# Patient Record
Sex: Female | Born: 1960 | Race: White | Hispanic: No | Marital: Single | Smoking: Never smoker
Health system: Southern US, Community
[De-identification: ages and names within clinical notes are randomized; demographics above are authoritative.]

## PROBLEM LIST (undated history)

## (undated) DIAGNOSIS — I10 Essential (primary) hypertension: Secondary | ICD-10-CM

## (undated) DIAGNOSIS — F32A Depression, unspecified: Secondary | ICD-10-CM

## (undated) DIAGNOSIS — E785 Hyperlipidemia, unspecified: Secondary | ICD-10-CM

## (undated) DIAGNOSIS — T7840XA Allergy, unspecified, initial encounter: Secondary | ICD-10-CM

## (undated) DIAGNOSIS — M26609 Unspecified temporomandibular joint disorder, unspecified side: Secondary | ICD-10-CM

## (undated) DIAGNOSIS — R682 Dry mouth, unspecified: Secondary | ICD-10-CM

## (undated) DIAGNOSIS — K589 Irritable bowel syndrome without diarrhea: Secondary | ICD-10-CM

## (undated) DIAGNOSIS — M8580 Other specified disorders of bone density and structure, unspecified site: Secondary | ICD-10-CM

## (undated) DIAGNOSIS — G43909 Migraine, unspecified, not intractable, without status migrainosus: Secondary | ICD-10-CM

## (undated) DIAGNOSIS — B009 Herpesviral infection, unspecified: Secondary | ICD-10-CM

## (undated) DIAGNOSIS — K219 Gastro-esophageal reflux disease without esophagitis: Secondary | ICD-10-CM

## (undated) DIAGNOSIS — F329 Major depressive disorder, single episode, unspecified: Secondary | ICD-10-CM

## (undated) HISTORY — DX: Depression, unspecified: F32.A

## (undated) HISTORY — DX: Migraine, unspecified, not intractable, without status migrainosus: G43.909

## (undated) HISTORY — DX: Other specified disorders of bone density and structure, unspecified site: M85.80

## (undated) HISTORY — DX: Unspecified temporomandibular joint disorder, unspecified side: M26.609

## (undated) HISTORY — DX: Major depressive disorder, single episode, unspecified: F32.9

## (undated) HISTORY — DX: Irritable bowel syndrome, unspecified: K58.9

## (undated) HISTORY — DX: Dry mouth, unspecified: R68.2

## (undated) HISTORY — DX: Hyperlipidemia, unspecified: E78.5

## (undated) HISTORY — DX: Allergy, unspecified, initial encounter: T78.40XA

## (undated) HISTORY — DX: Gastro-esophageal reflux disease without esophagitis: K21.9

## (undated) HISTORY — DX: Herpesviral infection, unspecified: B00.9

## (undated) HISTORY — DX: Essential (primary) hypertension: I10

## (undated) HISTORY — PX: BREAST SURGERY: SHX581

---

## 1990-09-11 HISTORY — PX: NASAL SEPTUM SURGERY: SHX37

## 2001-02-06 ENCOUNTER — Other Ambulatory Visit: Admission: RE | Admit: 2001-02-06 | Discharge: 2001-02-06 | Payer: Self-pay | Admitting: *Deleted

## 2001-04-03 ENCOUNTER — Encounter: Payer: Self-pay | Admitting: *Deleted

## 2001-04-03 ENCOUNTER — Encounter: Admission: RE | Admit: 2001-04-03 | Discharge: 2001-04-03 | Payer: Self-pay | Admitting: *Deleted

## 2002-02-17 ENCOUNTER — Encounter: Payer: Self-pay | Admitting: Family Medicine

## 2002-02-17 ENCOUNTER — Encounter: Admission: RE | Admit: 2002-02-17 | Discharge: 2002-02-17 | Payer: Self-pay | Admitting: Family Medicine

## 2003-03-12 ENCOUNTER — Other Ambulatory Visit: Admission: RE | Admit: 2003-03-12 | Discharge: 2003-03-12 | Payer: Self-pay | Admitting: Obstetrics & Gynecology

## 2003-03-26 ENCOUNTER — Encounter: Admission: RE | Admit: 2003-03-26 | Discharge: 2003-03-26 | Payer: Self-pay | Admitting: Obstetrics & Gynecology

## 2003-03-26 ENCOUNTER — Encounter: Payer: Self-pay | Admitting: Obstetrics & Gynecology

## 2004-03-24 ENCOUNTER — Other Ambulatory Visit: Admission: RE | Admit: 2004-03-24 | Discharge: 2004-03-24 | Payer: Self-pay | Admitting: Obstetrics and Gynecology

## 2004-04-06 ENCOUNTER — Encounter: Admission: RE | Admit: 2004-04-06 | Discharge: 2004-04-06 | Payer: Self-pay | Admitting: Obstetrics and Gynecology

## 2004-09-11 HISTORY — PX: ABDOMINAL HYSTERECTOMY: SHX81

## 2005-04-27 ENCOUNTER — Other Ambulatory Visit: Admission: RE | Admit: 2005-04-27 | Discharge: 2005-04-27 | Payer: Self-pay | Admitting: Obstetrics and Gynecology

## 2005-05-03 ENCOUNTER — Encounter: Admission: RE | Admit: 2005-05-03 | Discharge: 2005-05-03 | Payer: Self-pay | Admitting: Obstetrics and Gynecology

## 2005-12-07 ENCOUNTER — Inpatient Hospital Stay (HOSPITAL_COMMUNITY): Admission: RE | Admit: 2005-12-07 | Discharge: 2005-12-08 | Payer: Self-pay | Admitting: Obstetrics and Gynecology

## 2005-12-07 ENCOUNTER — Encounter (INDEPENDENT_AMBULATORY_CARE_PROVIDER_SITE_OTHER): Payer: Self-pay | Admitting: Specialist

## 2006-05-03 ENCOUNTER — Other Ambulatory Visit: Admission: RE | Admit: 2006-05-03 | Discharge: 2006-05-03 | Payer: Self-pay | Admitting: Obstetrics and Gynecology

## 2006-06-07 ENCOUNTER — Encounter: Admission: RE | Admit: 2006-06-07 | Discharge: 2006-06-07 | Payer: Self-pay | Admitting: Obstetrics and Gynecology

## 2007-08-14 ENCOUNTER — Encounter: Admission: RE | Admit: 2007-08-14 | Discharge: 2007-08-14 | Payer: Self-pay | Admitting: Obstetrics and Gynecology

## 2008-08-21 ENCOUNTER — Encounter: Admission: RE | Admit: 2008-08-21 | Discharge: 2008-08-21 | Payer: Self-pay | Admitting: Obstetrics and Gynecology

## 2009-04-20 LAB — HM COLONOSCOPY

## 2009-06-16 ENCOUNTER — Encounter: Payer: Self-pay | Admitting: Internal Medicine

## 2009-07-06 LAB — CONVERTED CEMR LAB: Pap Smear: NORMAL

## 2009-07-13 ENCOUNTER — Ambulatory Visit: Payer: Self-pay | Admitting: Internal Medicine

## 2009-07-13 DIAGNOSIS — K117 Disturbances of salivary secretion: Secondary | ICD-10-CM | POA: Insufficient documentation

## 2009-07-13 DIAGNOSIS — F39 Unspecified mood [affective] disorder: Secondary | ICD-10-CM | POA: Insufficient documentation

## 2009-07-13 DIAGNOSIS — I10 Essential (primary) hypertension: Secondary | ICD-10-CM | POA: Insufficient documentation

## 2009-07-13 DIAGNOSIS — J309 Allergic rhinitis, unspecified: Secondary | ICD-10-CM | POA: Insufficient documentation

## 2009-07-13 DIAGNOSIS — K219 Gastro-esophageal reflux disease without esophagitis: Secondary | ICD-10-CM

## 2009-07-13 DIAGNOSIS — E785 Hyperlipidemia, unspecified: Secondary | ICD-10-CM

## 2009-07-13 LAB — CONVERTED CEMR LAB
Angiotensin 1 Converting Enzyme: 33 units/L (ref 9–67)
Free T4: 0.9 ng/dL (ref 0.80–1.80)

## 2009-07-14 ENCOUNTER — Telehealth: Payer: Self-pay | Admitting: Internal Medicine

## 2009-07-19 ENCOUNTER — Telehealth: Payer: Self-pay | Admitting: Internal Medicine

## 2009-07-20 ENCOUNTER — Ambulatory Visit: Payer: Self-pay | Admitting: Internal Medicine

## 2009-07-30 ENCOUNTER — Ambulatory Visit: Payer: Self-pay | Admitting: Internal Medicine

## 2009-07-30 DIAGNOSIS — R5381 Other malaise: Secondary | ICD-10-CM

## 2009-07-30 DIAGNOSIS — R5383 Other fatigue: Secondary | ICD-10-CM

## 2009-07-30 LAB — CONVERTED CEMR LAB
ALT: 28 units/L (ref 0–35)
AST: 32 units/L (ref 0–37)
Albumin: 5.1 g/dL (ref 3.5–5.2)
Alkaline Phosphatase: 85 units/L (ref 39–117)
BUN: 12 mg/dL (ref 6–23)
Basophils Absolute: 0 10*3/uL (ref 0.0–0.1)
Bilirubin, Direct: 0.1 mg/dL (ref 0.0–0.3)
Chloride: 102 meq/L (ref 96–112)
Eosinophils Relative: 1 % (ref 0–5)
Glucose, Bld: 98 mg/dL (ref 70–99)
HCT: 40.8 % (ref 36.0–46.0)
Hemoglobin: 13.6 g/dL (ref 12.0–15.0)
Lymphocytes Relative: 31 % (ref 12–46)
Lymphs Abs: 1.5 10*3/uL (ref 0.7–4.0)
Monocytes Absolute: 0.4 10*3/uL (ref 0.1–1.0)
Neutro Abs: 3 10*3/uL (ref 1.7–7.7)
Potassium: 3.8 meq/L (ref 3.5–5.3)
RBC: 4.08 M/uL (ref 3.87–5.11)
RDW: 12.6 % (ref 11.5–15.5)
Sodium: 141 meq/L (ref 135–145)
Total Bilirubin: 0.3 mg/dL (ref 0.3–1.2)
WBC: 4.9 10*3/uL (ref 4.0–10.5)

## 2009-08-03 ENCOUNTER — Encounter: Payer: Self-pay | Admitting: Internal Medicine

## 2009-08-03 ENCOUNTER — Telehealth: Payer: Self-pay | Admitting: Internal Medicine

## 2009-08-20 ENCOUNTER — Ambulatory Visit (HOSPITAL_BASED_OUTPATIENT_CLINIC_OR_DEPARTMENT_OTHER): Admission: RE | Admit: 2009-08-20 | Discharge: 2009-08-20 | Payer: Self-pay | Admitting: Internal Medicine

## 2009-08-20 ENCOUNTER — Ambulatory Visit: Payer: Self-pay | Admitting: Diagnostic Radiology

## 2009-08-20 ENCOUNTER — Ambulatory Visit: Payer: Self-pay | Admitting: Family

## 2009-08-20 DIAGNOSIS — R0789 Other chest pain: Secondary | ICD-10-CM | POA: Insufficient documentation

## 2009-08-27 ENCOUNTER — Ambulatory Visit: Payer: Self-pay | Admitting: Internal Medicine

## 2009-08-27 ENCOUNTER — Ambulatory Visit: Payer: Self-pay | Admitting: Diagnostic Radiology

## 2009-08-27 ENCOUNTER — Ambulatory Visit (HOSPITAL_BASED_OUTPATIENT_CLINIC_OR_DEPARTMENT_OTHER): Admission: RE | Admit: 2009-08-27 | Discharge: 2009-08-27 | Payer: Self-pay | Admitting: Obstetrics and Gynecology

## 2009-08-27 LAB — HM MAMMOGRAPHY

## 2009-09-02 ENCOUNTER — Encounter: Admission: RE | Admit: 2009-09-02 | Discharge: 2009-09-02 | Payer: Self-pay | Admitting: Obstetrics and Gynecology

## 2009-09-17 ENCOUNTER — Ambulatory Visit: Payer: Self-pay | Admitting: Internal Medicine

## 2009-09-23 ENCOUNTER — Encounter: Payer: Self-pay | Admitting: Internal Medicine

## 2009-09-23 ENCOUNTER — Telehealth: Payer: Self-pay | Admitting: Internal Medicine

## 2009-09-28 ENCOUNTER — Telehealth: Payer: Self-pay | Admitting: Internal Medicine

## 2009-10-04 ENCOUNTER — Ambulatory Visit: Payer: Self-pay | Admitting: Internal Medicine

## 2009-10-04 DIAGNOSIS — N951 Menopausal and female climacteric states: Secondary | ICD-10-CM

## 2009-10-04 DIAGNOSIS — G43909 Migraine, unspecified, not intractable, without status migrainosus: Secondary | ICD-10-CM | POA: Insufficient documentation

## 2009-10-04 DIAGNOSIS — R259 Unspecified abnormal involuntary movements: Secondary | ICD-10-CM | POA: Insufficient documentation

## 2009-10-11 ENCOUNTER — Ambulatory Visit: Payer: Self-pay | Admitting: Internal Medicine

## 2009-10-11 DIAGNOSIS — M26629 Arthralgia of temporomandibular joint, unspecified side: Secondary | ICD-10-CM

## 2009-11-12 ENCOUNTER — Ambulatory Visit: Payer: Self-pay | Admitting: Internal Medicine

## 2009-11-12 DIAGNOSIS — K644 Residual hemorrhoidal skin tags: Secondary | ICD-10-CM | POA: Insufficient documentation

## 2010-10-02 ENCOUNTER — Encounter: Payer: Self-pay | Admitting: Obstetrics and Gynecology

## 2010-10-11 NOTE — Assessment & Plan Note (Signed)
Summary: Bad Headaches, ? From a new med.- jr   Vital Signs:  Patient profile:   50 year old female Menstrual status:  hysterectomy Weight:      119 pounds BMI:     22.20 O2 Sat:      96 % on Room air Temp:     98.1 degrees F oral Pulse rate:   75 / minute Pulse rhythm:   regular Resp:     18 per minute BP sitting:   150 / 90  (right arm) Cuff size:   regular  Vitals Entered By: Glendell Docker CMA (October 04, 2009 2:11 PM)  O2 Flow:  Room air  Primary Care Provider:  D. Thomos Lemons DO  CC:  Does not feel good.  History of Present Illness: 50 y/o c/o of severe migraine yesterday.  Pain was severe despite taking ibuprofen.  She took hydrocodone with some relief this AM.  She has assoc nausea.    Since prev visit, pt seen by Dr. Nolen Mu.  Zyprexa switched to geodon.   Lamictal dose increased.  since stopping zyprexa she has restless sensation.  she also has severe insomnia.  she is only able to sleep 1-2 hrs.    Also seen by her GYN.  Pt having severe hot flashes and sweating.    She is still lving in same household as fiance. home life and work life - miserable.  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  no specific tx recommended.  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        increased appetite  Physical Exam  General:  alert, well-developed, and well-nourished.   Head:  normocephalic and atraumatic.   Ears:  R ear normal and L ear normal.   Neck:  supple and no masses.   Lungs:  normal respiratory effort and normal breath sounds.   Heart:  normal rate, regular rhythm, and no gallop.   Psych:  tearful and moderately anxious.     Impression & Recommendations:  Problem # 1:  AKATHISIA (ICD-781.0) Pt switched from zyprexa to geodon.  I suspect some component of her symptoms from akathisia.  Pt to discuss with Dr. Nolen Mu re:  dosing changes.  Problem # 2:  MIGRAINE HEADACHE (ICD-346.90) Assessment: Deteriorated Pt having severe migraines.  It is likely triggered by recent psych med changes.  Continue ibuprofen as needed.  she has hydrocodone on hand for refractory headaches.    Pt unable to function at work due to severe migrains.  I suggest out of work x 1 week then reassess.  Her updated medication list for this problem includes:    Ibuprofen 200 Mg Caps (Ibuprofen) .Marland Kitchen..Marland Kitchen Two tablets by mouth every 6 hours as needed  Problem # 3:  HOT FLASHES (  ICD-627.2) Pt having severe hot flashes. she has hx of hysterectomy.  we discussed risks of HRT.  we agreed to use short term  Her updated medication list for this problem includes:    Estradiol 1 Mg Tabs (Estradiol) ..... 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 Nolen Mu.  Use ambien CR 6.25 mg for sleep.  Complete Medication List: 1)  Vitamin D (ergocalciferol) 50000 Unit Caps (Ergocalciferol) .... One capsule by mouth once per week 2)  Valacyclovir Hcl 1 Gm Tabs (Valacyclovir hcl) .... Take 1 tablet by mouth once a day for 7 days if you experience outbreak/flare 3)  Ambien Cr 6.25 Mg Cr-tabs (Zolpidem tartrate) .... One by mouth at bedtime prn 4)  Probiotic Formula Caps (Probiotic product) .... One capsule by mouth once per week 5)  Amlodipine Besylate  2.5 Mg Tabs (Amlodipine besylate) .... Take 1 tablet by mouth once a day 6)  Simvastatin 40 Mg Tabs (Simvastatin) .... 1/2 by mouth once daily 7)  Fluticasone Propionate 50 Mcg/act Susp (Fluticasone propionate) .... 2 sprays each nostril once daily 8)  Omega-3 350 Mg Caps (Omega-3 fatty acids) .... 2 once daily 9)  Acetyl L-carnitine 250 Mg Caps (Acetylcarnitine hcl) .... Two times a day 10)  Lamotrigine 100 Mg Tabs (Lamotrigine) .... 1/2 by mouth two times a day 11)  Ibuprofen 200 Mg Caps (Ibuprofen) .... Two tablets by mouth every 6 hours as needed 12)  Geodon 60 Mg Caps (Ziprasidone hcl) .... Take 1 capsule by mouth once a day  at bedtime 13)  Estradiol 1 Mg Tabs (Estradiol) .... One by mouth once daily  Patient Instructions: 1)  Please schedule a follow-up appointment in 2 weeks. Prescriptions: ESTRADIOL 1 MG TABS (ESTRADIOL) one by mouth once daily  #30 x 1   Entered and Authorized by:   D. Thomos Lemons DO   Signed by:   D. Thomos Lemons DO on 10/04/2009   Method used:   Electronically to        [facility] (retail)       [address] [place], [place]  [address]       Ph: [phone]       Fax: [phone]   RxID:   [id number] SIMVASTATIN 40 MG TABS (SIMVASTATIN) 1/2 by mouth once daily  #30 x 2   Entered and Authorized by:   D. Thomos Lemons DO   Signed by:   D. Thomos Lemons DO on 10/04/2009   Method used:   Electronically to        [facility] (retail)       [address] [place], [place]  [address]       Ph: [phone]       Fax: [phone]   RxID:   [id number] AMBIEN CR 6.25 MG CR-TABS (ZOLPIDEM TARTRATE) one by mouth at bedtime prn  #30 x 1   Entered and Authorized by:   D. Thomos Lemons DO   Signed by:   D. Thomos Lemons DO on 10/04/2009   Method used:   Print then Give to Patient   RxID:   [id number]   Current Allergies (reviewed today): No known  allergies

## 2010-10-11 NOTE — Progress Notes (Signed)
Summary: Fluticasone Refill  Phone Note Refill Request Message from:  Fax from Pharmacy on September 23, 2009 8:20 AM  Refills Requested: Medication #1:  FLUTICASONE PROPIONATE 50 MCG/ACT SUSP 2 sprays each nostril once daily   Dosage confirmed as above?Dosage Confirmed   Brand Name Necessary? No   Supply Requested: 1 month   Last Refilled: 12/09/2008  Method Requested: Electronic Next Appointment Scheduled: 11-12-09 8[address] Dr Artist Pais  Initial call taken by: Roselle Locus,  September 23, 2009 8:21 AM  Follow-up for Phone Call        Rx completed in Dr. Tiajuana Amass Follow-up by: Glendell Docker CMA,  September 23, 2009 9:56 AM    Prescriptions: FLUTICASONE PROPIONATE 50 MCG/ACT SUSP (FLUTICASONE PROPIONATE) 2 sprays each nostril once daily  #1 x 3   Entered by:   Glendell Docker CMA   Authorized by:   D. Thomos Lemons DO   Signed by:   Glendell Docker CMA on 09/23/2009   Method used:   Electronically to        [facility] (retail)       [address] [place], [place]  [address]       Ph: [phone]       Fax: [phone]   RxID:   [id number]

## 2010-10-11 NOTE — Letter (Signed)
Summary: Work Dietitian at [employer]. [address]   [place], [place] [address]   Phone: [phone]  Fax: [phone]      Today's Date: October 04, 2009   Name of Patient: [name]   The above named patient had a medical visit today .Please take this into consideration when reviewing the time away from work.  Special Instructions:  Arly.Keller ] None  [  ] To be off the remainder of today, returning to the normal work / school schedule tomorrow.  [  ] To be off until the next scheduled appointment on ______________________.  [  ] Other ________________________________________________________________ ________________________________________________________________________     Sincerely yours,   Glendell Docker CMA Dr Thomos Lemons

## 2010-10-11 NOTE — Assessment & Plan Note (Signed)
Summary: 2 month follow up/mhf   Vital Signs:  Patient profile:   50 year old female Menstrual status:  hysterectomy Weight:      119 pounds BMI:     22.20 O2 Sat:      92 % on Room air Temp:     97.9 degrees F oral Pulse rate:   62 / minute Pulse rhythm:   regular Resp:     18 per minute BP sitting:   132 / 80  (right arm) Cuff size:   regular  Vitals Entered By: Glendell Docker CMA (November 12, 2009 8:26 AM)  O2 Flow:  Room air CC: RM 2- Follow up  Is Patient Diabetic? No Comments follow up moving to [place] next week, refills on Anamantle,  transederm pacth, Lamictal,Ambien, Estradiol and Amlodipine   Primary Care Provider:  Dondra Spry DO  CC:  RM 2- Follow up .  History of Present Illness: 50 y/o white female for follow up mood - feeling much better.   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   Neck:  supple and no masses.   Lungs:  normal respiratory effort and normal breath  sounds.   Heart:  normal rate, regular rhythm, no murmur, and no gallop.   Psych:  normally interactive, good eye contact, not anxious appearing, and not depressed appearing.     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  she is planning to move to [facility].   good family support.  Problem # 2:  HOT FLASHES (ICD-627.2) Assessment: Improved good response to estradiol.    Her updated medication list for this problem includes:    Estradiol 1 Mg Tabs (Estradiol) ..... One by mouth once daily  Problem # 3:  HYPERTENSION (ICD-401.9) stable.  Maintain current medication regimen.  Her updated medication list for this problem includes:    Amlodipine Besylate 2.5 Mg Tabs (Amlodipine besylate) .Marland Kitchen... Take 1 tablet by mouth once a day  BP today: 132/80 Prior BP: 126/72 (10/11/2009)  Labs Reviewed: K+: 3.8 (07/30/2009) Creat: : 0.77 (07/30/2009)     Problem # 4:  HEMORRHOIDS, EXTERNAL (ICD-455.3) she gets occ hemorrhoids.  refilled anamantle  Complete Medication List: 1)  Vitamin D (ergocalciferol) 50000 Unit Caps (Ergocalciferol) .... One capsule by mouth once per week 2)  Valacyclovir Hcl 1 Gm Tabs (Valacyclovir hcl) .... Take 1  tablet by mouth once a day for 7 days if you experience outbreak/flare 3)  Ambien Cr 6.25 Mg Cr-tabs (Zolpidem tartrate) .... One by mouth at bedtime prn 4)  Amlodipine Besylate 2.5 Mg Tabs (Amlodipine besylate) .... Take 1 tablet by mouth once a day 5)  Simvastatin 40 Mg Tabs (Simvastatin) .... 1/2 by mouth once daily 6)  Fluticasone Propionate 50 Mcg/act Susp (Fluticasone propionate) .... 2 sprays each nostril once daily 7)  Omega-3 350 Mg Caps (Omega-3 fatty acids) .... 2 once daily 8)  Acetyl L-carnitine 250 Mg Caps (Acetylcarnitine hcl) .... Two times a day 9)  Lamotrigine 100 Mg Tabs (Lamotrigine) .... 1/2 by mouth two times a day 10)  Ibuprofen 200 Mg Caps (Ibuprofen) .... Two  tablets by mouth every 6 hours as needed 11)  Estradiol 1 Mg Tabs (Estradiol) .... One by mouth once daily 12)  Xanax 0.5 Mg Tabs (Alprazolam) .... 1/2 to 1 tablet by mouth once daily as needed 13)  Zoloft 100 Mg Tabs (Sertraline hcl) .... Take 1 tablet by mouth once a day 14)  Transderm-scop 1.5 Mg Pt72 (Scopolamine base) .... Apply new patch q 3 days 15)  Lidocaine-hydrocortisone Ace 3-0.5 % Crea (Lidocaine-hydrocortisone ace) .... Use as directed Prescriptions: LIDOCAINE-HYDROCORTISONE ACE 3-0.5 % CREA (LIDOCAINE-HYDROCORTISONE ACE) use as directed  #7 grams x 5   Entered and Authorized by:   D. Thomos Lemons DO   Signed by:   D. Thomos Lemons DO on 11/12/2009   Method used:   Print then Give to Patient   RxID:   [id number] TRANSDERM-SCOP 1.5 MG PT72 (SCOPOLAMINE BASE) apply new patch q 3 days  #5 x 0   Entered and Authorized by:   D. Thomos Lemons DO   Signed by:   D. Thomos Lemons DO on 11/12/2009   Method used:   Print then Give to Patient   RxID:   [id number] ESTRADIOL 1 MG TABS (ESTRADIOL) one by mouth once daily  #30 x 3   Entered and Authorized by:   D. Thomos Lemons DO   Signed by:   D. Thomos Lemons DO on 11/12/2009   Method used:   Print then Give to Patient   RxID:   [id number] LAMOTRIGINE 100 MG TABS (LAMOTRIGINE) 1/2 by mouth two times a day  #30 x 3   Entered and Authorized by:   D. Thomos Lemons DO   Signed by:   D. Thomos Lemons DO on 11/12/2009   Method used:   Print then Give to Patient   RxID:   [id number] SIMVASTATIN 40 MG TABS (SIMVASTATIN) 1/2 by mouth once daily  #30 x 3   Entered and Authorized by:   D. Thomos Lemons DO   Signed by:   D. Thomos Lemons DO on 11/12/2009   Method used:   Print then Give to Patient   RxID:   [id number] AMLODIPINE BESYLATE 2.5 MG TABS (AMLODIPINE BESYLATE) Take 1 tablet by mouth once a day  #30 x 3   Entered and Authorized by:   D. Thomos Lemons DO   Signed by:   D. Thomos Lemons DO on 11/12/2009   Method used:   Print then Give to  Patient   RxID:   [id number] AMBIEN CR 6.25 MG CR-TABS (ZOLPIDEM TARTRATE) one by mouth at bedtime prn  #30 x 3   Entered and Authorized by:   D. Thomos Lemons DO   Signed by:   D. Thomos Lemons DO on 11/12/2009   Method used:  Print then Give to Patient   RxID:   [id number]   Current Allergies (reviewed today): No known allergies

## 2010-10-11 NOTE — Assessment & Plan Note (Signed)
Summary: 1 WEEK FOLLOW UP/MHF   Vital Signs:  Patient profile:   51 year old female Menstrual status:  hysterectomy Weight:      119 pounds BMI:     22.20 O2 Sat:      98 % on Room air Temp:     98.3 degrees F oral Pulse rate:   84 / minute Pulse rhythm:   regular Resp:     18 per minute BP sitting:   126 / 72  (right arm) Cuff size:   regular  Vitals Entered By: Glendell Docker CMA (October 11, 2009 10:05 AM)  O2 Flow:  Room air  Primary Care Provider:  D. Thomos Lemons DO  CC:  1 Week Followup .  History of Present Illness: 50 y/o white female for follow up. she was seen by Dr. Raquel James - psych.  she was started on zoloft.  feeling better. sleep quality much improved hot flashes resolved with HRT  Hx of TMJ  she as seen at [facility].  there was concern for early shingles.  she started acyclovir left sided facial paraesthesias improved.  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   Head:  no facial rash Ears:  R ear normal and L ear normal.   Neck:  left TMJ tenderness Lungs:  normal respiratory effort and normal breath sounds.   Heart:  normal rate, regular rhythm, and no  gallop.     Impression & Recommendations:  Problem # 1:  HOT FLASHES (ICD-627.2) Assessment Improved hot flashed resolved.  use current dose x 6 months then start to taper  Her updated medication list for this problem includes:    Estradiol 1 Mg Tabs (Estradiol) ..... One by mouth once daily  Problem # 2:  TMJ PAIN (ICD-524.62) Pt recently seen at [facility].  she noted left sided facial pain.  there was concern for possible shingles.  she is taking acyclovir in case symptoms from early shingles.  I agree.  I suspect pain is from TMJ.  pt advised to continue use of oral appliance.    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 Raquel James.  she is on zoloft and alprazolam as needed.  she is on FMLA until March.  We discussed breathing exercises / medication as part of overall treatment to help manage stress and anxiety  Complete Medication List: 1)  Vitamin D (ergocalciferol) 50000 Unit Caps (Ergocalciferol) .... One capsule by mouth once per week 2)  Valacyclovir Hcl 1 Gm Tabs (Valacyclovir hcl) .... Take 1 tablet by mouth once a day for 7 days if you experience outbreak/flare 3)  Ambien Cr 6.25  Mg Cr-tabs (Zolpidem tartrate) .... One by mouth at bedtime prn 4)  Probiotic Formula Caps (Probiotic product) .... One capsule by mouth once per week 5)  Amlodipine Besylate 2.5 Mg Tabs (Amlodipine besylate) .... Take 1 tablet by mouth once a day 6)  Simvastatin 40 Mg Tabs (Simvastatin) .... 1/2 by mouth once daily 7)  Fluticasone Propionate 50 Mcg/act Susp (Fluticasone propionate) .... 2 sprays each nostril once daily 8)  Omega-3 350 Mg Caps (Omega-3 fatty acids) .... 2 once daily 9)  Acetyl L-carnitine 250 Mg Caps (Acetylcarnitine hcl) .... Two times a day 10)  Lamotrigine 100 Mg Tabs (Lamotrigine) .... 1/2 by mouth two times a day 11)  Ibuprofen 200 Mg Caps (Ibuprofen) .... Two tablets by mouth every 6 hours as needed 12)  Estradiol 1 Mg Tabs (Estradiol) ....  One by mouth once daily 13)  Xanax 0.5 Mg Tabs (Alprazolam) .... 1/2 to 1 tablet by mouth once daily as needed 14)  Zoloft 100 Mg Tabs (Sertraline hcl) .... Take 1 tablet by mouth once a day  Current Allergies (reviewed today): No known allergies

## 2010-10-11 NOTE — Progress Notes (Signed)
Summary: FMLA paperwork  Phone Note Call from Patient   Caller: Patient Reason for Call: Talk to Doctor Summary of Call: Cell#[phone]  Patient called today and was very nice on the phone, but is upset and stating that she has left 3 messages for Darlene on her voicemail and has actually talked  to Darlene once in reference to [facility] paperwork. Pt. states that Agustin Cree was very rude to her and was trying to tell the patient that she has already got her FMLA paper work back from [employer] when patient knows for a fact that Darlene did not give her the paperwork back. Patient states, she would just like for Darlene to acknowlege that Paperwork was never given back to patient and that if it was lost it is okay, she would like for [place] simply to fill it out again if needed. Pt. states that she does not want to cause anyone trouble, but she would like to just get her FMLA paperwork back and not argue with Darlene. She is very happy with being a patient of Dr.Dashanti Burr's , but is not impressed with Darlene's bed side manner. I do not see anywhere in EMR where Darlene and pt's conversation was documented. Pt. was very nice and asked me just to talk to Dr.Lamir Racca about it and call her back at the cell number she left. Can we help this patient resolve this issue? Thank you for your help, Michaelle Copas Initial call taken by: Michaelle Copas,  September 28, 2009 2:29 PM  Follow-up for Phone Call        please ask pt to drop off FMLA again.  we do not copy in the office.  I am not sure what happened to it Follow-up by: D. Thomos Lemons DO,  September 28, 2009 2:52 PM  Additional Follow-up for Phone Call Additional follow up Details #1::        informed patient that she can drop off her paper work again and Dr.Perrie Ragin will be glad to fill out her paper work for her again. Pt. states she will bring by this week and was very appreciative. Michaelle Copas  September 28, 2009 2:59 PM

## 2010-10-11 NOTE — Consult Note (Signed)
Summary: [facility]  [facility]   Imported By: Lanelle Bal 11/04/2009 10:15:09  _____________________________________________________________________  External Attachment:    Type:   Image     Comment:   External Document

## 2010-10-11 NOTE — Assessment & Plan Note (Signed)
Summary: 2 week follow up/mhf   Vital Signs:  Patient profile:   50 year old female Menstrual status:  hysterectomy Weight:      118.25 pounds BMI:     22.06 O2 Sat:      99 % on Room air Temp:     98.4 degrees F oral Pulse rate:   75 / minute Pulse rhythm:   regular Resp:     16 per minute BP sitting:   132 / 80  (right arm) Cuff size:   regular  Vitals Entered By: Glendell Docker CMA (September 17, 2009 3:50 PM)  O2 Flow:  Room air  Primary Care Provider:  D. Thomos Lemons DO  CC:  2 Week Follow up .  History of Present Illness: 2 week follow up   Interval Psychiatric History      The patient comes in today for follow-up individual therapy.  The patient complains of insomnia and disturbed sleep pattens.  The patient reports relationship difficulties and poor self-image.  Overall, the patient reports that they feel that their current state of mental health has remained unchanged.  she has noticed increased appetite with zyprexa.  ongoing arguments with her boss.  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   Lungs:  normal respiratory effort and normal breath sounds.   Heart:  normal rate, regular rhythm, and no gallop.   Psych:  normally interactive and slightly anxious.     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   she has noticed  increase in appetite with zyprexa but no wt gain.  continue lamictal.  arrange f/u with Dr. Nolen Mu.  Complete Medication List: 1)  Vitamin D (ergocalciferol) 50000 Unit Caps (Ergocalciferol) .... One capsule by mouth once per week 2)  Valacyclovir Hcl 1 Gm Tabs (Valacyclovir hcl) .... Take 1 tablet by mouth once a day for 7 days if you experience outbreak/flare 3)  Ambien 10 Mg Tabs (Zolpidem tartrate) .... Take 1 tablet by mouth once a day 4)  Probiotic Formula Caps (Probiotic product) .... One capsule by mouth once per week 5)  Amlodipine Besylate 2.5 Mg Tabs (Amlodipine besylate) .... Take 1 tablet by mouth once a day 6)  Zocor 80 Mg Tabs (Simvastatin) .... Take 1/2  tablet by mouth once a day 7)  Fluticasone Propionate 50 Mcg/act Susp (Fluticasone propionate) .... 2 sprays each nostril once daily 8)  Omega-3 350 Mg Caps (Omega-3 fatty acids) .... 2 once daily 9)  Acetyl L-carnitine 250 Mg Caps (Acetylcarnitine hcl) .... Two times a day 10)  Lamotrigine 100 Mg Tabs (Lamotrigine) .... 1/2 by mouth two times a day 11)  Ibuprofen 200 Mg Caps (Ibuprofen) .... Two tablets by mouth every 6 hours as needed 12)  Zyprexa 5 Mg Tabs (Olanzapine) .... One by mouth at bedtime  Patient Instructions: 1)  Please schedule a follow-up appointment in 2 months.  Current Allergies (reviewed today): No known allergies

## 2011-01-27 NOTE — H&P (Signed)
NAME:  [name], [name]                ACCOUNT NO.:  [id number]   MEDICAL RECORD NO.:  [id number]          PATIENT TYPE:  AMB   LOCATION:  SDC                           FACILITY:  [facility]   PHYSICIAN:  Osborn Coho, M.D.   DATE OF BIRTH:  06/05/1961   DATE OF ADMISSION:  DATE OF DISCHARGE:                                HISTORY & PHYSICAL   HISTORY OF PRESENT ILLNESS:  [name] is a 50 year old single white  female, gravida 0, who presents for total vaginal hysterectomy because of  symptomatic uterine fibroids, dyspareunia and post coital bleeding.  The  patient's menstrual periods are characterized by a four day flow, in which  she changes a bad every four hours and finds relief from her cramping with  over-the-counter nonsteroidal antiinflammatory medications.  For the past  six months, the reports experiencing deep dyspareunia, along with post  coital bleeding, which has been quite disturbing.  In spite of a course of  oral contraceptives and Lupron, which she used for three months, the patient  still continued postcoital bleeding, though her pain diminished.  A pelvic  ultrasound with sonohistogram in November 2006 showed a normal endometrium  and three measurable fibroids with the largest measuring 5 x 4 cm.  After  having been on Lupron for three months, a followup ultrasound showed that  there had been no changes in the size of her fibroids.  Additionally, the  patient has never had an abnormal Pap smear, with her most recent one being  in August 2006. Due to the failure of two medical therapies and the  disruptive nature of her symptoms, the patient wishes to proceed with  definitive therapy in the form of hysterectomy.   OBSTETRIC HISTORY:  Gravida 0.   GYNECOLOGIC HISTORY:  Menarche at 50 years old.  The patient's last  menstrual period was October 09, 2005 (on Lupron).  She uses vasectomy as  her method of contraception. 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  Her last Pap smear was August  2006.   MEDICAL HISTORY:  1.  Seasonal allergies.  2.  Irritable bowel syndrome.  3.  Migraines.  4.  TMJ.  5.  Depression.  6.  Anxiety.  7.  Osteopenia.   SURGICAL HISTORY:  1.  1984 breast augmentation.  2.  1992 sinus surgery with repair of deviated septum and chin implant.  3.  1994 breast augmentation.  4.  2005 eye lid surgery.   The patient denies any problems with anesthesia and denies any blood  transfusions.   FAMILY HISTORY:  Positive for rheumatic fever, hypertension, diabetes  mellitus, cardiovascular disease and mitral valve prolapse.   SOCIAL HISTORY:  The patient is single.  She lives with her fiancee and  works as a Pharmacologist.   HABITS:  She does not use tobacco.  She consumes alcohol occasionally,  denies any illicit drug use.   CURRENT MEDICATIONS:  1.  Lexapro 10 mg daily.  2.  Zelnorm 6 mg daily.  3.  Zyrtec  10 mg as needed.  4.  Allegra 180 mg as needed.   The patient has no known drug allergies.   REVIEW OF SYSTEMS:  The patient denies any chest pain, shortness of breath,  fever, cough, nausea, vomiting, diarrhea or urinary tract symptoms.  She  does admit, however to occasional constipation.  Otherwise, except as  mentioned in history of present illness, the patient's review of systems are  negative.   PHYSICAL EXAMINATION:  VITAL SIGNS:  Blood pressure 130/72.  Height 5 fee 2  inches tall.  Weight 112 pounds.  NECK:  Supple without masses.  There is no thyromegaly or adenopathy.  HEART:  Regular rate and rhythm.  There is no murmur.  LUNGS:  Clear to auscultation.  BACK:  No CVA tenderness.  ABDOMEN:  Bowel sounds are present.  It is soft without tenderness,  guarding, rebound or organomegaly.  EXTREMITIES:  Without clubbing, cyanosis or edema.  PELVIC:  EG BUS is within normal limits.  Vagina is rugose.  Cervix is  nontender without lesions.  Uterus is  enlarged to 14 week size, nontender.  Adnexa without tenderness or masses.   IMPRESSION:  1.  Symptomatic uterine fibroids.  2.  Dyspareunia.  3.  Post coital bleeding.   DISPOSITION:  A discussion was held with patient regarding the indications  for her procedure, along with the risks, which include, but are not limited  to reaction to anesthesia, damage to adjacent organs, infection, excessive  bleeding and the possibility that her surgery may have to be done  abdominally if the size of her uterus precludes a vaginal procedure.  The  patient has consented to proceed with a total vaginal hysterectomy with the  possibility of a total abdominal hysterectomy at [facility] on December 07, 2005 at 9:30 a.m.      Elmira J. Adline Peals.      Osborn Coho, M.D.  Electronically Signed    EJP/MEDQ  D:  12/05/2005  T:  12/05/2005  Job:  [id number]

## 2011-01-27 NOTE — Discharge Summary (Signed)
[name], [name]                ACCOUNT NO.:  [id number]   MEDICAL RECORD NO.:  [id number]          PATIENT TYPE:  INP   LOCATION:  9302                          [facility]ACILITY:  [facility]   PHYSICIAN:  Osborn Coho, M.D.   DATE OF BIRTH:  [birth year]   DATE OF ADMISSION:  12/07/2005  DATE OF DISCHARGE:  12/08/2005                                 DISCHARGE SUMMARY   DISCHARGE DIAGNOSES:  1.  Fibroid uterus.  2.  Adenomyosis.  3.  Endometrial polyp.  4.  Complex endometrial hyperplasia without atypia.  5.  Pelvic adhesions.  6.  Dyspareunia and postcoital bleeding.   OPERATION:  On the date of admission, the patient underwent a total vaginal  hysterectomy with uterine morcellation, lysis of adhesions, and cystoscopy  tolerating all procedures well.  The patient was found to have a uterus  which was enlarged with a 6-cm fundal fibroid with an aggregate weight of  185.9 grams.  The patient's ureters were found to be patent bilaterally by  cystoscopy.   HISTORY OF PRESENT ILLNESS:  [name] is a 50 year old, single, white  female, gravida 0 who presents for total vaginal hysterectomy because of  symptomatic uterine fibroids, dyspareunia, and post coital bleeding.  Please  see the patient's dictated history and physical examination for details.   PREOPERATIVE PHYSICAL EXAMINATION:  VITAL SIGNS:  Blood pressure 130/72,  height is 5 feet 2 inches tall, weight 112 pounds.  GENERAL:  Within normal limits.  PELVIC:  EG/BUS is within normal limits.  Vagina is rugose.  Cervix is  nontender without lesions.  Uterus is enlarged to 14 weeks size without  tenderness.  Adnexa without tenderness or masses.   HOSPITAL COURSE:  On the date of admission, the patient underwent  aforementioned procedures, tolerating them all well.  By post-op day #1, the  patient had resumed bowel and bladder function and tolerated a post-op  hemoglobin of 9.9 (pre-op hemoglobin was 13.8) and was therefore deemed  ready  for discharge home.   DISCHARGE MEDICATIONS:  1.  Tylox 1-2 tablets every four hours as needed for pain.  2.  Colace 100 mg, one tablet twice daily until bowel movements are regular.  3.  Phenergan 25 mg, one tablet every six hours as needed for nausea.  4.  Repleva (iron) one tablet daily for 21 days.  To discontinue for seven      days, then resume for another 21 days.  5.  Ciprofloxacin 250 mg twice daily for five days.   FOLLOWUP:  The patient is scheduled for a 6 weeks postoperative visit with  Dr. Su Hilt on Jan 17, 2006 at 2:30 p.m.   DISCHARGE INSTRUCTIONS:  1.  The patient was given a copy of [facility] postoperative      instruction sheet.  2.  She was further advised to avoid driving for two weeks, heavy lifting      for four weeks, intercourse for six weeks.  3.  That she may shower, walk up steps, should increase her activities      slowly.   DIET:  Without restriction.   FINAL PATHOLOGY:  Uterus hysterectomy:  Uterine cervix with benign  ectocervical and endocervical mucosa.  Uterine corpus with benign  proliferative endometrium, adenomyosis, intramural leiomyomata, and  endometrial polyp with complex endometrial hyperplasia without atypia.      Elmira J. Adline Peals.      Osborn Coho, M.D.  Electronically Signed    EJP/MEDQ  D:  12/26/2005  T:  12/27/2005  Job:  [id number]

## 2011-01-27 NOTE — Op Note (Signed)
[name], [name]                ACCOUNT NO.:  [id number]   MEDICAL RECORD NO.:  [id number]          PATIENT TYPE:  AMB   LOCATION:  SDC                           [facility]   PHYSICIAN:  Osborn Coho, M.D.   DATE OF BIRTH:  1961/03/02   DATE OF PROCEDURE:  12/07/2005  DATE OF DISCHARGE:                                 OPERATIVE REPORT   PREOPERATIVE DIAGNOSIS:  1.  Symptomatic fibroid uterus.  2.  Postcoital bleeding.  3.  Dyspareunia.   POSTOPERATIVE DIAGNOSIS:  1.  Symptomatic fibroid uterus.  2.  Postcoital bleeding.  3.  Dyspareunia.  4.  Pelvic adhesions.   PROCEDURE:  Total vaginal hysterectomy with uterine morcellation, lysis of  adhesions and cystoscopy.   ATTENDING:  Osborn Coho, M.D.   ASSISTANT:  Marquis Lunch. Adline Peals.   ANESTHESIA:  General.   SPECIMEN:  To pathology; uterus, cervix and fibroids.   ESTIMATED BLOOD LOSS:  500 mL.   URINE OUTPUT:  350 mL.   IV FLUIDS:  2500 mL.   COMPLICATIONS:  None.   FINDINGS:  Large fundal fibroid approximately 6 cm.   PROCEDURE:  The patient was taken to the operating room after risks,  benefits, alternatives reviewed with the patient.  The patient verbalized  understanding and consent signed and witnessed.  The patient was placed  under general anesthesia, prepped and draped in normal sterile fashion in  the dorsolithotomy position.  A weighted speculum was placed in the  patient's vagina and the cervix circumscribed with the Bovie.  The posterior  cul-de-sac was entered and the anterior cul-de-sac entered as well.  The  paracervical tissue was clamped with the Heaney clamp, cut and suture  ligated using 0 Vicryl incorporating the cardinal and uterosacral ligaments.  The cervix was amputated as it was difficult to get to the upper portion of  the uterus secondary to the patient is nulliparous.  Uterus was also noted  to be deviated to the patient's left.  The parametrial tissue was clamped  with the  curved Heaney, cut and suture ligated using 0 Vicryl and a large  fundal fibroid was noted.  The fibroid was morcellated in order to remove  vaginally and the remaining utero-ovarian pedicles were clamped, cut and  tied with free ties.  Several adhesions were noted to the uterine fundus  particularly on the patient's left.  Secondary to multiple adhesions and  deviation of the uterus, cystoscopy was performed and indigo carmine  administered with bilateral efflux of indigo carmine from the ureters.  There was oozing noted from bilateral adnexa which was repaired with 2-0  Vicryl and cauterized with the Bovie.  The main portion of the oozing was  noted on the left and several adhesions had to be removed in order to obtain  hemostasis.  Bilateral ovaries appeared within normal limits. The cuff was  closed with 0 Vicryl via interrupted stitches and the vagina was packed with  2-0 packing soaked with Estrace cream.  Sponge lap and needle count was  correct.  The patient tolerated the procedure well and is  currently being  transferred to the recovery room.      Osborn Coho, M.D.  Electronically Signed     AR/MEDQ  D:  12/07/2005  T:  12/08/2005  Job:  [id number]

## 2011-03-09 ENCOUNTER — Encounter: Payer: Self-pay | Admitting: Internal Medicine

## 2013-05-08 ENCOUNTER — Ambulatory Visit: Payer: Self-pay | Admitting: Oncology

## 2013-05-08 ENCOUNTER — Ambulatory Visit: Payer: Self-pay

## 2015-09-11 ENCOUNTER — Encounter (HOSPITAL_BASED_OUTPATIENT_CLINIC_OR_DEPARTMENT_OTHER): Payer: Self-pay | Admitting: *Deleted

## 2015-09-11 ENCOUNTER — Emergency Department (HOSPITAL_BASED_OUTPATIENT_CLINIC_OR_DEPARTMENT_OTHER)
Admission: EM | Admit: 2015-09-11 | Discharge: 2015-09-11 | Disposition: A | Discharge status: Home / Self Care | Payer: Managed Care, Other (non HMO) | Attending: Emergency Medicine | Admitting: Emergency Medicine

## 2015-09-11 ENCOUNTER — Emergency Department (HOSPITAL_BASED_OUTPATIENT_CLINIC_OR_DEPARTMENT_OTHER): Payer: Managed Care, Other (non HMO)

## 2015-09-11 DIAGNOSIS — I10 Essential (primary) hypertension: Secondary | ICD-10-CM | POA: Diagnosis not present

## 2015-09-11 DIAGNOSIS — Z8719 Personal history of other diseases of the digestive system: Secondary | ICD-10-CM | POA: Insufficient documentation

## 2015-09-11 DIAGNOSIS — Z793 Long term (current) use of hormonal contraceptives: Secondary | ICD-10-CM | POA: Diagnosis not present

## 2015-09-11 DIAGNOSIS — M79602 Pain in left arm: Secondary | ICD-10-CM | POA: Diagnosis present

## 2015-09-11 DIAGNOSIS — R0602 Shortness of breath: Secondary | ICD-10-CM | POA: Diagnosis not present

## 2015-09-11 DIAGNOSIS — R200 Anesthesia of skin: Secondary | ICD-10-CM | POA: Insufficient documentation

## 2015-09-11 DIAGNOSIS — E785 Hyperlipidemia, unspecified: Secondary | ICD-10-CM | POA: Diagnosis not present

## 2015-09-11 DIAGNOSIS — Z7951 Long term (current) use of inhaled steroids: Secondary | ICD-10-CM | POA: Diagnosis not present

## 2015-09-11 DIAGNOSIS — R05 Cough: Secondary | ICD-10-CM | POA: Insufficient documentation

## 2015-09-11 DIAGNOSIS — R202 Paresthesia of skin: Secondary | ICD-10-CM | POA: Insufficient documentation

## 2015-09-11 DIAGNOSIS — Z79899 Other long term (current) drug therapy: Secondary | ICD-10-CM | POA: Insufficient documentation

## 2015-09-11 DIAGNOSIS — Z8619 Personal history of other infectious and parasitic diseases: Secondary | ICD-10-CM | POA: Diagnosis not present

## 2015-09-11 DIAGNOSIS — M542 Cervicalgia: Secondary | ICD-10-CM | POA: Diagnosis not present

## 2015-09-11 DIAGNOSIS — G43909 Migraine, unspecified, not intractable, without status migrainosus: Secondary | ICD-10-CM | POA: Diagnosis not present

## 2015-09-11 DIAGNOSIS — F319 Bipolar disorder, unspecified: Secondary | ICD-10-CM | POA: Diagnosis not present

## 2015-09-11 LAB — CBC
HCT: 41.5 % (ref 36.0–46.0)
HEMOGLOBIN: 13.5 g/dL (ref 12.0–15.0)
MCH: 32.6 pg (ref 26.0–34.0)
MCHC: 32.5 g/dL (ref 30.0–36.0)
MCV: 100.2 fL — ABNORMAL HIGH (ref 78.0–100.0)
Platelets: 374 10*3/uL (ref 150–400)
RBC: 4.14 MIL/uL (ref 3.87–5.11)
RDW: 12.7 % (ref 11.5–15.5)
WBC: 8.1 10*3/uL (ref 4.0–10.5)

## 2015-09-11 LAB — BASIC METABOLIC PANEL
Anion gap: 8 (ref 5–15)
BUN: 12 mg/dL (ref 6–20)
CALCIUM: 9.6 mg/dL (ref 8.9–10.3)
CO2: 27 mmol/L (ref 22–32)
Chloride: 99 mmol/L — ABNORMAL LOW (ref 101–111)
Creatinine, Ser: 0.81 mg/dL (ref 0.44–1.00)
GFR calc Af Amer: >60 mL/min (ref >60)
GFR calc non Af Amer: >60 mL/min (ref >60)
Glucose, Bld: 95 mg/dL (ref 65–99)
Potassium: 4.2 mmol/L (ref 3.5–5.1)
SODIUM: 134 mmol/L — AB (ref 135–145)

## 2015-09-11 LAB — TROPONIN I

## 2015-09-11 NOTE — Discharge Instructions (Signed)
Paresthesia Paresthesia is an abnormal burning or prickling sensation. This sensation is generally felt in the hands, arms, legs, or feet. However, it may occur in any part of the body. Usually, it is not painful. The feeling may be described as:  Tingling or numbness.  Pins and needles.  Skin crawling.  Buzzing.  Limbs falling asleep.  Itching. Most people experience temporary (transient) paresthesia at some time in their lives. Paresthesia may occur when you breathe too quickly (hyperventilation). It can also occur without any apparent cause. Commonly, paresthesia occurs when pressure is placed on a nerve. The sensation quickly goes away after the pressure is removed. For some people, however, paresthesia is a long-lasting (chronic) condition that is caused by an underlying disorder. If you continue to have paresthesia, you may need further medical evaluation. HOME CARE INSTRUCTIONS Watch your condition for any changes. Taking the following actions may help to lessen any discomfort that you are feeling:  Avoid drinking alcohol.  Try acupuncture or massage to help relieve your symptoms.  Keep all follow-up visits as directed by your health care provider. This is important. SEEK MEDICAL CARE IF:  You continue to have episodes of paresthesia.  Your burning or prickling feeling gets worse when you walk.  You have pain, cramps, or dizziness.  You develop a rash. SEEK IMMEDIATE MEDICAL CARE IF:  You feel weak.  You have trouble walking or moving.  You have problems with speech, understanding, or vision.  You feel confused.  You cannot control your bladder or bowel movements.  You have numbness after an injury.  You faint.   This information is not intended to replace advice given to you by your health care provider. Make sure you discuss any questions you have with your health care provider.   Document Released: 08/18/2002 Document Revised: 01/12/2015 Document Reviewed:  08/24/2014 Elsevier Interactive Patient Education 2016 [employer] Inc[employer]  

## 2015-09-11 NOTE — ED Provider Notes (Signed)
CSN: [id number]     Arrival date & time 09/11/15  1412 History  By signing my name below, I, Budd Palmer, attest that this documentation has been prepared under the direction and in the presence of Azalia Bilis, MD. Electronically Signed: Budd Palmer, ED Scribe. 09/11/2015. 4:12 PM.    Chief Complaint  Patient presents with  . Arm Pain   The history is provided by the patient. No language interpreter was used.   HPI Comments: [name] is a 54 y.o. female with a PMHx of HTN, HLD, and migraines who presents to the Emergency Department complaining of constant, tightness left arm pain onset 6 hours ago. Pt states she was holding her phone when the pain began, but states that after she put the phone down and rubbed the arm, the pain did not resolve. She notes it feels as though the circulation of the lower arm has been cut-off. She reports associated headache (onset this morning), numbness and tingling along the arm radiating up the left side of the neck and to the left ear, as well as pain on the left side of the neck. She notes she currently has a cold with associated mild cough and some SOB (yesterday), and diaphoresis and has been taking OTC medication for this. She states she also had a shot of bourbon with her tea for the cough. She denies any recent trauma or injury. She notes she is here for the holidays from [place]. She notes she is scheduled for surgery on her foot in a few months. She denies a PMHx of DM and states she does not smoke. Pt denies clumsiness of the left hand, numbness or tingling of the LLE, and fever.   Past Medical History  Diagnosis Date  . Allergy   . IBS (irritable bowel syndrome)   . Depression     Bipolar  . Migraines   . TMJ (temporomandibular joint disorder)   . Osteopenia   . Hypertension   . Hyperlipidemia   . GERD (gastroesophageal reflux disease)   . HSV-1 (herpes simplex virus 1) infection     positive antibody  . HSV-2 (herpes simplex virus  2) infection     postive antibody, no hx fo genital ulcers  . Dry mouth     negative Sjogrens work up   Past Surgical History  Procedure Laterality Date  . Breast surgery  1984, 1994, 2008    augmentation in 1984 with silicon implants that leaked-revised in 1994 then 2008 (left breast)  . Nasal septum surgery  1992  . Abdominal hysterectomy  2006   Family History  Problem Relation Age of Onset  . Depression Sister   . Hyperlipidemia Other   . Hypertension Other    Social History  Substance Use Topics  . Smoking status: Never Smoker   . Smokeless tobacco: None  . Alcohol Use: No   OB History    No data available     Review of Systems A complete 10 system review of systems was obtained and all systems are negative except as noted in the HPI and PMH.   Allergies  Review of patient's allergies indicates not on file.  Home Medications   Prior to Admission medications   Medication Sig Start Date End Date Taking? Authorizing Provider  lisinopril (PRINIVIL,ZESTRIL) 20 MG tablet Take 20 mg by mouth daily.   Yes Historical Provider, MD  Acetylcarnitine HCl (ACETYL L-CARNITINE) 250 MG CAPS Take 1 capsule by mouth 2 (two) times daily.  Historical Provider, MD  ergocalciferol (VITAMIN D2) 50000 UNITS capsule Take 50,000 Units by mouth once a week.      Historical Provider, MD  estradiol (ESTRACE) 1 MG tablet Take 1 mg by mouth daily.      Historical Provider, MD  fluticasone (FLONASE) 50 MCG/ACT nasal spray Place 2 sprays into the nose daily.      Historical Provider, MD  ibuprofen (ADVIL,MOTRIN) 200 MG tablet Take 400 mg by mouth every 6 (six) hours as needed.      Historical Provider, MD  lamoTRIgine (LAMICTAL) 100 MG tablet Take 1/2 tablet by mouth two times a day     Historical Provider, MD  Lidocaine-Hydrocortisone Ace 3-0.5 % CREA Apply topically as directed.      Historical Provider, MD  Omega-3 350 MG CAPS Take 2 capsules by mouth daily.      Historical Provider, MD   simvastatin (ZOCOR) 40 MG tablet Take 40 mg by mouth at bedtime.      Historical Provider, MD   BP 136/72 mmHg  Pulse 71  Temp(Src) 98.3 F (36.8 C) (Oral)  Resp 18  Ht 5\' 1"  (1.549 m)  Wt 135 lb (61.236 kg)  BMI 25.52 kg/m2  SpO2 100% Physical Exam  Constitutional: She is oriented to person, place, and time. She appears well-developed and well-nourished. No distress.  HENT:  Head: Normocephalic and atraumatic.  Eyes: EOM are normal.  Neck: Normal range of motion. Neck supple.  C-spine is nontender.  No spasms of the left trapezius muscle noted.  No rash.  No lymphadenopathy.  Cardiovascular: Normal rate, regular rhythm and normal heart sounds.   Pulmonary/Chest: Effort normal and breath sounds normal.  Abdominal: Soft. She exhibits no distension. There is no tenderness.  Musculoskeletal: Normal range of motion.  Normal left radial pulse.  Full range of motion of left wrist, left elbow, left shoulder.  No swelling of the left upper extremity as compared to the right.  No discoloration of the left upper extremity.  Neurological: She is alert and oriented to person, place, and time.  5 out of 5 strength of bilateral upper lower extremity major muscle groups.  Face is symmetric.  Speech is normal.  Normal sensation to light touch throughout her arms and face.  Skin: Skin is warm and dry.  Psychiatric: She has a normal mood and affect. Judgment normal.  Nursing note and vitals reviewed.   ED Course  Procedures  DIAGNOSTIC STUDIES: Oxygen Saturation is 99% on RA, normal by my interpretation.    COORDINATION OF CARE: 3:53 PM - Discussed normal EKG. Discussed plans to order diagnostic studies and imaging of the chest and head. Pt advised of plan for treatment and pt agrees.  Labs Review Labs Reviewed  BASIC METABOLIC PANEL - Abnormal; Notable for the following:    Sodium 134 (*)    Chloride 99 (*)    All other components within normal limits  CBC - Abnormal; Notable for the  following:    MCV 100.2 (*)    All other components within normal limits  TROPONIN I    Imaging Review Dg Chest 2 View  09/11/2015  CLINICAL DATA:  54 year old female with left-sided chest and arm pain for 6 hours. EXAM: CHEST  2 VIEW COMPARISON:  08/20/2009 FINDINGS: The cardiomediastinal silhouette is unremarkable. There is no evidence of focal airspace disease, pulmonary edema, suspicious pulmonary nodule/mass, pleural effusion, or pneumothorax. No acute bony abnormalities are identified. IMPRESSION: No active cardiopulmonary disease. Electronically Signed  By: Harmon Pier M.D.   On: 09/11/2015 16:13   Ct Head Wo Contrast  09/11/2015  CLINICAL DATA:  Initial encounter for headache around the left side of face since 0 900 today. EXAM: CT HEAD WITHOUT CONTRAST TECHNIQUE: Contiguous axial images were obtained from the base of the skull through the vertex without intravenous contrast. COMPARISON:  None. FINDINGS: There is no evidence for acute hemorrhage, hydrocephalus, mass lesion, or abnormal extra-axial fluid collection. No definite CT evidence for acute infarction. The visualized paranasal sinuses and mastoid air cells are clear. IMPRESSION: Normal exam. Electronically Signed   By: Kennith Center M.D.   On: 09/11/2015 16:17   I have personally reviewed and evaluated these images and lab results as part of my medical decision-making.   EKG Interpretation   Date/Time:  Saturday September 11 2015 14:23:07 EST Ventricular Rate:  83 PR Interval:  138 QRS Duration: 82 QT Interval:  350 QTC Calculation: 411 R Axis:   78 Text Interpretation:  Normal sinus rhythm Nonspecific T wave abnormality  Abnormal ECG No old tracing to compare Confirmed by GOLDSTON  MD, SCOTT  ([id number]) on 09/11/2015 2:26:28 PM      MDM   Final diagnoses:  Paresthesia of left arm    Patient overall is well appearing.  My suspicion for stroke is very low at this time.  She has paresthesias of her left upper  extremity with normal motor function of her bilateral upper and lower extremities.  Ambulation is normal.  Labs, EKG, head CT normal.  Doubt ACS.  Patient will need to follow-up with her primary care physician and her neurologist when she returns to [place].  She understands to return to this emergency department of the nearest emergency department for any new or worsening symptoms.  She was given copies of her labs, imaging studies, EKG for follow-up purposes.  I personally performed the services described in this documentation, which was scribed in my presence. The recorded information has been reviewed and is accurate.       Azalia Bilis, MD 09/11/15 [phone]

## 2015-09-11 NOTE — ED Notes (Signed)
HA, left arm pain, jaw pain and upper left sided CP x 1 day.

## 2017-06-25 IMAGING — CT CT HEAD W/O CM
1 series · 16 of 30 positions shown, 20 images · non-contrast
Comparison: None.

CLINICAL DATA: Initial encounter for headache around the left side
of face since [DATE] today.

EXAM:
CT HEAD WITHOUT CONTRAST
TECHNIQUE: Contiguous axial images were obtained from the base of the skull
through the vertex without intravenous contrast.

[Series 2: head wo · axial · 0.42mm/px · z∈[-158,-28]mm · 16 of 30 slices shown, 20 images]
[im 2/30  brain]
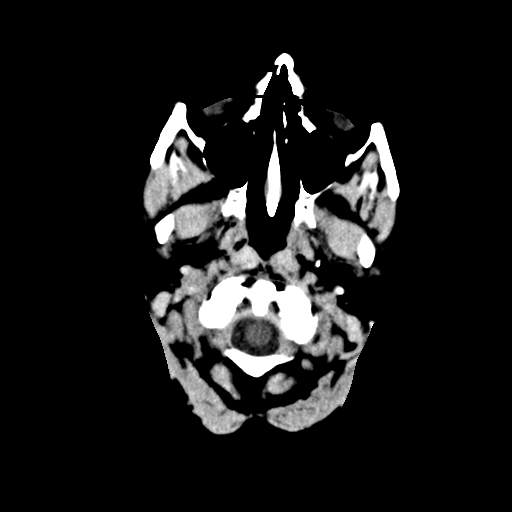
[im 2/30  bone]
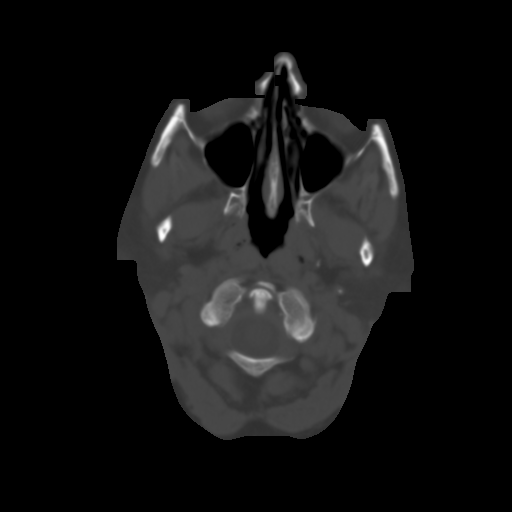
[im 4/30  brain]
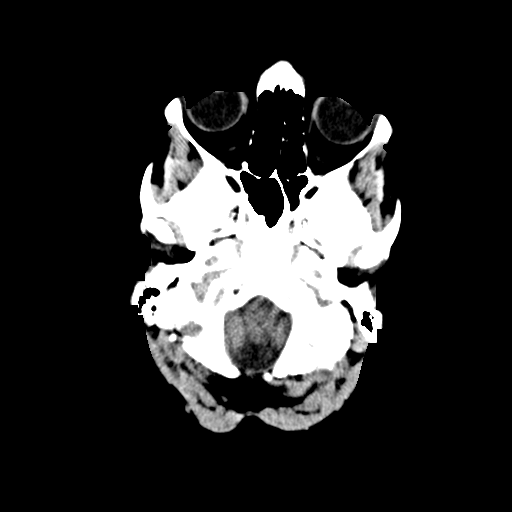
[im 6/30  brain]
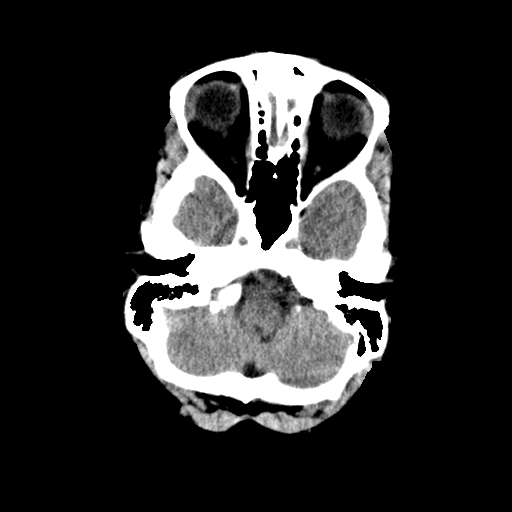
[im 8/30  brain]
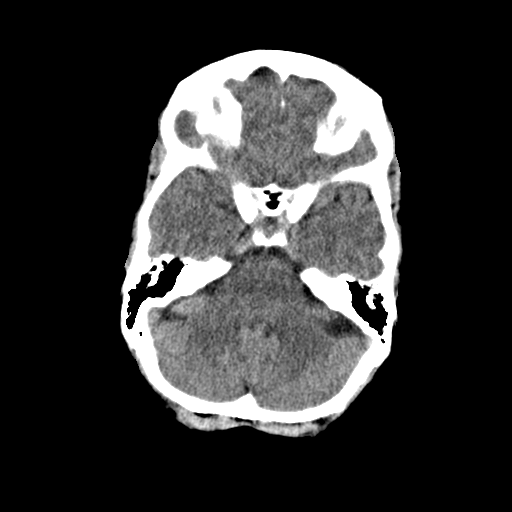
[im 9/30  brain]
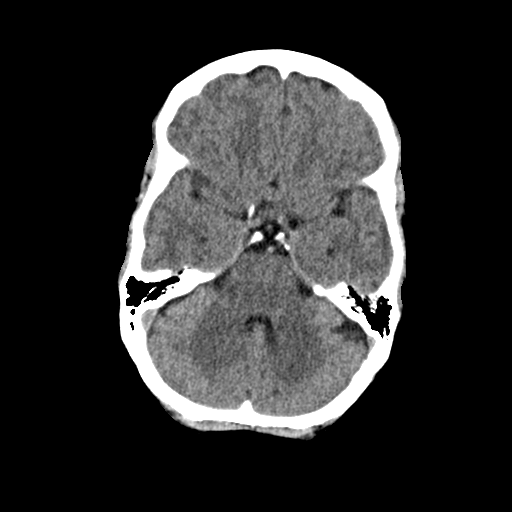
[im 9/30  bone]
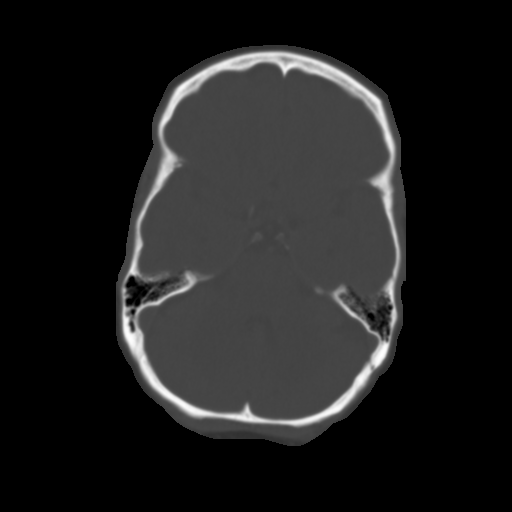
[im 11/30  brain]
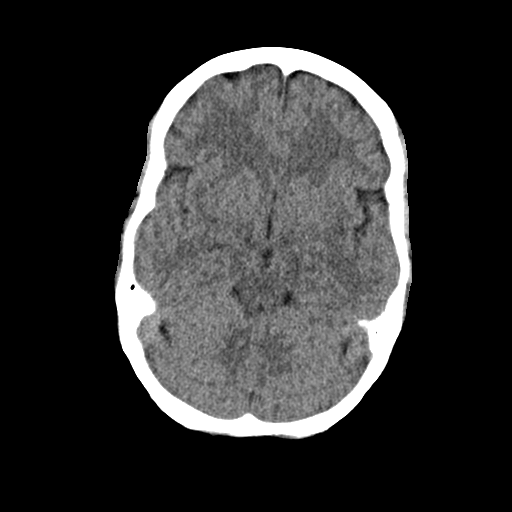
[im 13/30  brain]
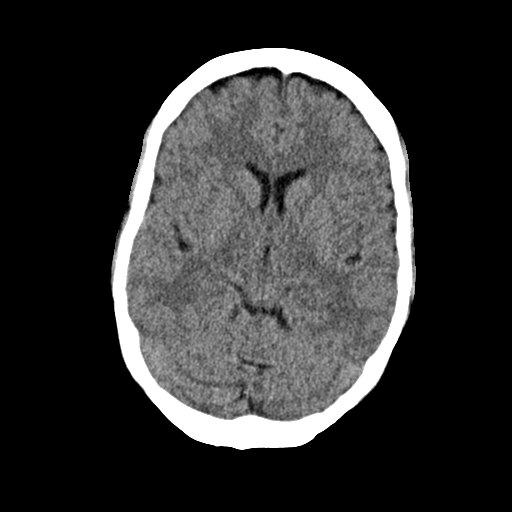
[im 15/30  brain]
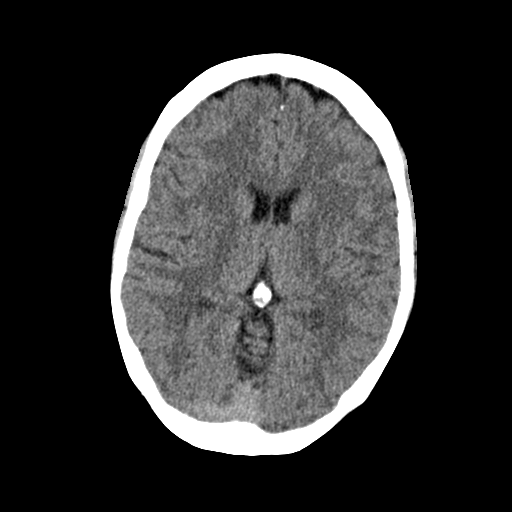
[im 16/30  brain]
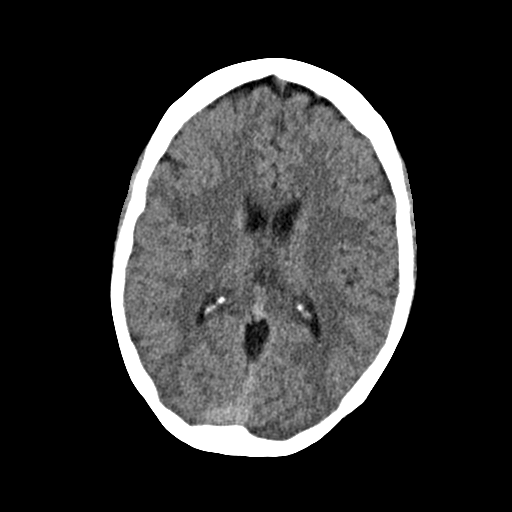
[im 16/30  bone]
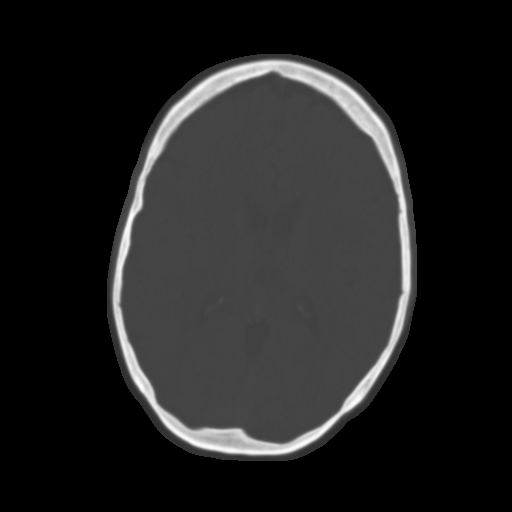
[im 18/30  brain]
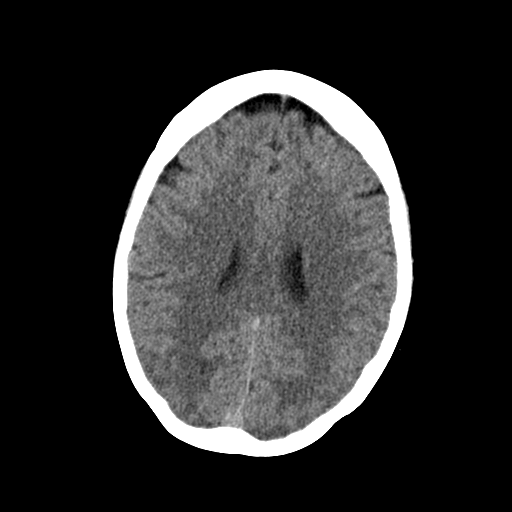
[im 20/30  brain]
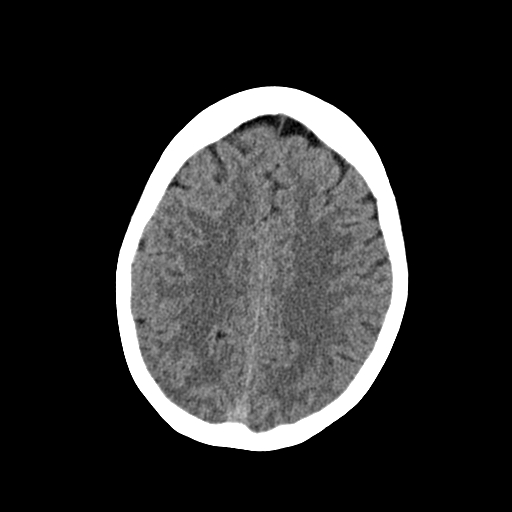
[im 22/30  brain]
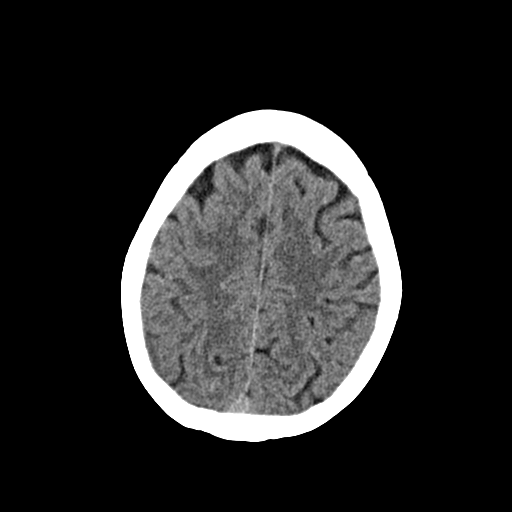
[im 23/30  brain]
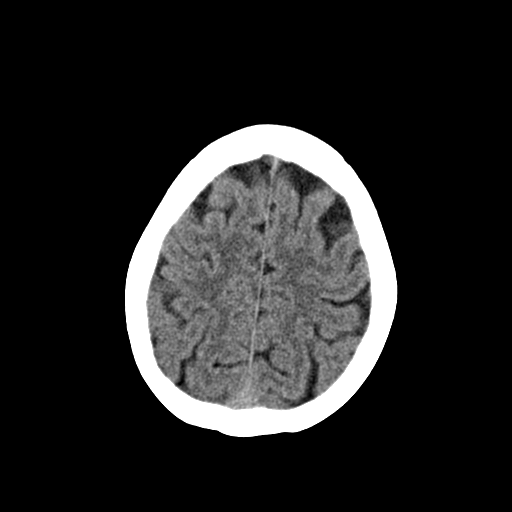
[im 23/30  bone]
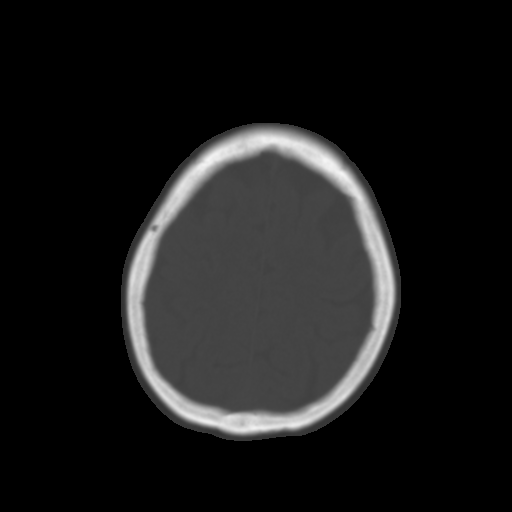
[im 25/30  brain]
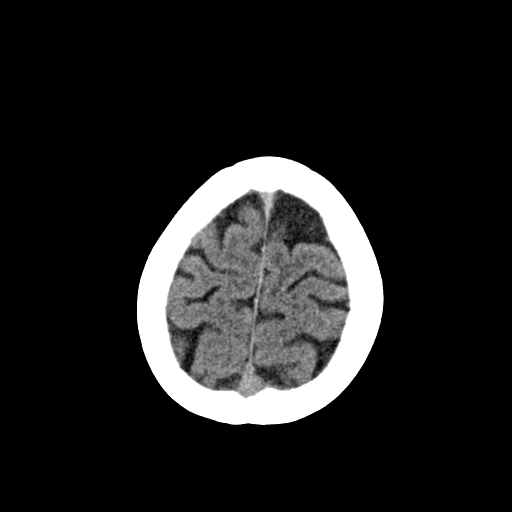
[im 27/30  brain]
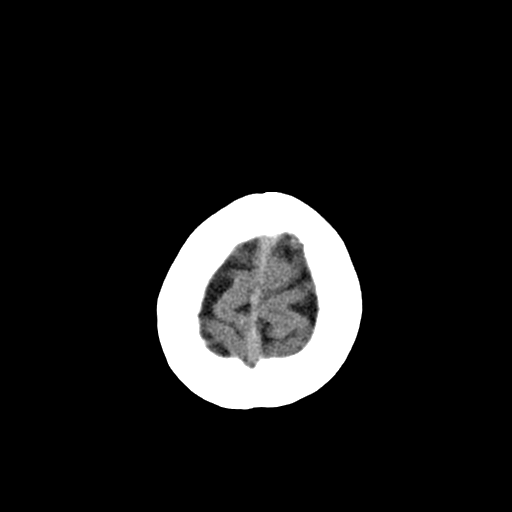
[im 29/30  brain]
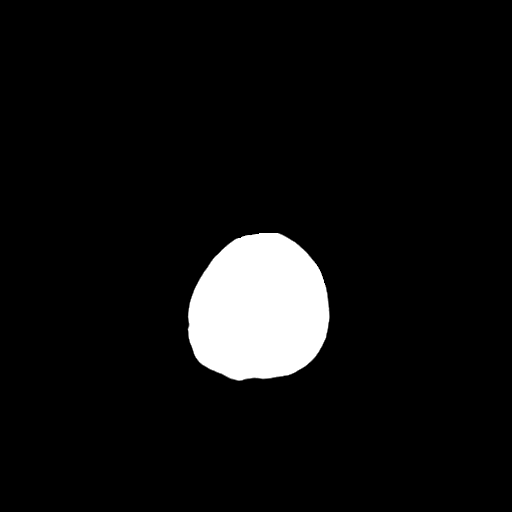

[16 of 30 positions shown; findings below may reference images not displayed]

FINDINGS: There is no evidence for acute hemorrhage, hydrocephalus, mass
lesion, or abnormal extra-axial fluid collection. No definite CT
evidence for acute infarction. The visualized paranasal sinuses and
mastoid air cells are clear.
IMPRESSION: Normal exam.
